# Patient Record
Sex: Female | Born: 1975 | Race: Black or African American | Hispanic: No | Marital: Single | Smoking: Never smoker
Health system: Southern US, Community
[De-identification: ages and names within clinical notes are randomized; demographics above are authoritative.]

## PROBLEM LIST (undated history)

## (undated) DIAGNOSIS — I10 Essential (primary) hypertension: Secondary | ICD-10-CM

---

## 2015-11-19 ENCOUNTER — Emergency Department (HOSPITAL_COMMUNITY)
Admission: EM | Admit: 2015-11-19 | Discharge: 2015-11-19 | Disposition: A | Discharge status: Home / Self Care | Payer: Self-pay | Attending: Emergency Medicine | Admitting: Emergency Medicine

## 2015-11-19 ENCOUNTER — Encounter (HOSPITAL_COMMUNITY): Payer: Self-pay | Admitting: Emergency Medicine

## 2015-11-19 DIAGNOSIS — R519 Headache, unspecified: Secondary | ICD-10-CM

## 2015-11-19 DIAGNOSIS — R51 Headache: Secondary | ICD-10-CM | POA: Insufficient documentation

## 2015-11-19 DIAGNOSIS — I10 Essential (primary) hypertension: Secondary | ICD-10-CM | POA: Insufficient documentation

## 2015-11-19 HISTORY — DX: Essential (primary) hypertension: I10

## 2015-11-19 MED ORDER — HYDROCHLOROTHIAZIDE 25 MG PO TABS: 25.0000 mg | ORAL_TABLET | Freq: Every day | ORAL | Status: DC

## 2015-11-19 MED ORDER — PROCHLORPERAZINE EDISYLATE 5 MG/ML IJ SOLN
10.0000 mg | Freq: Once | INTRAMUSCULAR | Status: AC
  Administered 2015-11-19: 10 mg via INTRAVENOUS
  Filled 2015-11-19: qty 2

## 2015-11-19 MED ORDER — KETOROLAC TROMETHAMINE 30 MG/ML IJ SOLN
30.0000 mg | Freq: Once | INTRAMUSCULAR | Status: AC
  Administered 2015-11-19: 30 mg via INTRAVENOUS
  Filled 2015-11-19: qty 1

## 2015-11-19 MED ORDER — DIPHENHYDRAMINE HCL 50 MG/ML IJ SOLN
25.0000 mg | Freq: Once | INTRAMUSCULAR | Status: AC
  Administered 2015-11-19: 25 mg via INTRAVENOUS
  Filled 2015-11-19: qty 1

## 2015-11-19 NOTE — ED Provider Notes (Signed)
CSN: [id number]     Arrival date & time 11/19/15  0227 History  By signing my name below, I, Freida Busman, attest that this documentation has been prepared under the direction and in the presence of Melene Plan, DO . Electronically Signed: Freida Busman, Scribe. 11/19/2015. 3:31 AM.     Chief Complaint  Patient presents with  . Hypertension  . Headache    The history is provided by the patient. No language interpreter was used.   HPI Comments:  Gwendolyn Kerr is a 40 y.o. female with a history of HTN who presents to the Emergency Department complaining of HA x 2 days with diffuse pain throughout. She rates her pain is a 7/10. Her pain is occasionally exacerbated by sounds. She reports h/o HA when BP is elevated but notes pain today is  Similar to past HAs. She reports taking a medication from [place] for her BP which she states is a diuretic but does not believe it is working. She denies CP at this time.   Past Medical History  Diagnosis Date  . Hypertension    History reviewed. No pertinent past surgical history. No family history on file. Social History  Substance Use Topics  . Smoking status: Never Smoker   . Smokeless tobacco: None  . Alcohol Use: No   OB History    No data available     Review of Systems  Constitutional: Negative for fever and chills.  HENT: Negative for congestion and rhinorrhea.   Eyes: Negative for redness and visual disturbance.  Respiratory: Negative for shortness of breath and wheezing.   Cardiovascular: Negative for chest pain and palpitations.  Gastrointestinal: Negative for nausea and vomiting.  Genitourinary: Negative for dysuria and urgency.  Musculoskeletal: Negative for myalgias and arthralgias.  Skin: Negative for pallor and wound.  Neurological: Positive for headaches. Negative for dizziness.  All other systems reviewed and are negative.  Allergies  Review of patient's allergies indicates not on file.  Home Medications   Prior to  Admission medications   Not on File   BP 158/98 mmHg  Pulse 66  Temp(Src) 98 F (36.7 C) (Oral)  Resp 18  Ht  (1.676 m)  Wt 192 lb (87.091 kg)  BMI 31.00 kg/m2  SpO2 98% Physical Exam  Constitutional: She is oriented to person, place, and time. She appears well-developed and well-nourished. No distress.  HENT:  Head: Normocephalic and atraumatic.  Eyes: EOM are normal. Pupils are equal, round, and reactive to light.  Neck: Normal range of motion. Neck supple.  Cardiovascular: Normal rate and regular rhythm.  Exam reveals no gallop and no friction rub.   No murmur heard. Pulmonary/Chest: Effort normal. She has no wheezes. She has no rales.  Abdominal: Soft. She exhibits no distension. There is no tenderness.  Musculoskeletal: She exhibits no edema or tenderness.  Neurological: She is alert and oriented to person, place, and time. She has normal strength. She is not disoriented. No cranial nerve deficit or sensory deficit. Coordination and gait normal. GCS eye subscore is 4. GCS verbal subscore is 5. GCS motor subscore is 6. She displays no Babinski's sign on the right side. She displays no Babinski's sign on the left side.  Reflex Scores:      Tricep reflexes are 2+ on the right side and 2+ on the left side.      Bicep reflexes are 2+ on the right side and 2+ on the left side.      Brachioradialis reflexes are  2+ on the right side and 2+ on the left side.      Patellar reflexes are 2+ on the right side and 2+ on the left side.      Achilles reflexes are 2+ on the right side and 2+ on the left side. Benign neuro exam  Skin: Skin is warm and dry. She is not diaphoretic.  Psychiatric: She has a normal mood and affect. Her behavior is normal.  Nursing note and vitals reviewed.   ED Course  Procedures   DIAGNOSTIC STUDIES:  Oxygen Saturation is 99% on RA, normal by my interpretation.    COORDINATION OF CARE:  3:28 AM Discussed treatment plan with pt at bedside and pt  agreed to plan.  Labs Review Labs Reviewed - No data to display  Imaging Review No results found. I have personally reviewed and evaluated these images and lab results as part of my medical decision-making.   EKG Interpretation None      MDM   Final diagnoses:  Acute nonintractable headache, unspecified headache type   40 yo F With a chief complaint of headache. This feels like her prior. She denies head injury fevers or chills. Benign neuro exam. No red flags. Treat with a headache cocktail.  Headache completely relieved with migraine cocktail. Discharge home.  5:16 AM:  I have discussed the diagnosis/risks/treatment options with the patient and believe the pt to be eligible for discharge home to follow-up with PCP. We also discussed returning to the ED immediately if new or worsening sx occur. We discussed the sx which are most concerning (e.g., sudden worsening pain, fever) that necessitate immediate return. Medications administered to the patient during their visit and any new prescriptions provided to the patient are listed below.  Medications given during this visit Medications  prochlorperazine (COMPAZINE) injection 10 mg (10 mg Intravenous Given 11/19/15 0431)  diphenhydrAMINE (BENADRYL) injection 25 mg (25 mg Intravenous Given 11/19/15 0427)  ketorolac (TORADOL) 30 MG/ML injection 30 mg (30 mg Intravenous Given 11/19/15 0434)    New Prescriptions   No medications on file    The patient appears reasonably screen and/or stabilized for discharge and I doubt any other medical condition or other [facility] requiring further screening, evaluation, or treatment in the ED at this time prior to discharge.     I personally performed the services described in this documentation, which was scribed in my presence. The recorded information has been reviewed and is accurate.     Melene Planan Chyler Creely, DO 11/19/15 [phone]

## 2015-11-19 NOTE — ED Notes (Signed)
C/o high blood pressure and headache since Sunday morning.  Reports she takes BP medication from [place] but she doesn't think it is working because it is a diuretic and she hasn't been urinating more.

## 2015-11-19 NOTE — Discharge Instructions (Signed)

## 2018-02-05 ENCOUNTER — Encounter (HOSPITAL_COMMUNITY): Payer: Self-pay | Admitting: Emergency Medicine

## 2018-02-05 ENCOUNTER — Emergency Department (HOSPITAL_COMMUNITY)
Admission: EM | Admit: 2018-02-05 | Discharge: 2018-02-06 | Disposition: A | Discharge status: Home / Self Care | Payer: Self-pay | Attending: Emergency Medicine | Admitting: Emergency Medicine

## 2018-02-05 ENCOUNTER — Other Ambulatory Visit: Payer: Self-pay

## 2018-02-05 DIAGNOSIS — Z8679 Personal history of other diseases of the circulatory system: Secondary | ICD-10-CM

## 2018-02-05 DIAGNOSIS — I10 Essential (primary) hypertension: Secondary | ICD-10-CM | POA: Insufficient documentation

## 2018-02-05 DIAGNOSIS — Z79899 Other long term (current) drug therapy: Secondary | ICD-10-CM | POA: Insufficient documentation

## 2018-02-05 DIAGNOSIS — Z9114 Patient's other noncompliance with medication regimen: Secondary | ICD-10-CM

## 2018-02-05 LAB — URINALYSIS, ROUTINE W REFLEX MICROSCOPIC
BILIRUBIN URINE: NEGATIVE
Bacteria, UA: NONE SEEN
GLUCOSE, UA: NEGATIVE mg/dL
Ketones, ur: NEGATIVE mg/dL
LEUKOCYTES UA: NEGATIVE
NITRITE: NEGATIVE
PH: 7 (ref 5.0–8.0)
Protein, ur: 30 mg/dL — AB
Specific Gravity, Urine: 1.004 — ABNORMAL LOW (ref 1.005–1.030)

## 2018-02-05 LAB — CBC
HCT: 52.2 % — ABNORMAL HIGH (ref 36.0–46.0)
Hemoglobin: 16.4 g/dL — ABNORMAL HIGH (ref 12.0–15.0)
MCH: 26.8 pg (ref 26.0–34.0)
MCHC: 31.4 g/dL (ref 30.0–36.0)
MCV: 85.3 fL (ref 78.0–100.0)
PLATELETS: 210 10*3/uL (ref 150–400)
RBC: 6.12 MIL/uL — AB (ref 3.87–5.11)
RDW: 13.4 % (ref 11.5–15.5)
WBC: 9.3 10*3/uL (ref 4.0–10.5)

## 2018-02-05 LAB — BASIC METABOLIC PANEL
ANION GAP: 15 (ref 5–15)
BUN: 12 mg/dL (ref 6–20)
CO2: 29 mmol/L (ref 22–32)
CREATININE: 0.96 mg/dL (ref 0.44–1.00)
Calcium: 10.4 mg/dL — ABNORMAL HIGH (ref 8.9–10.3)
Chloride: 92 mmol/L — ABNORMAL LOW (ref 98–111)
GFR calc Af Amer: >60 mL/min (ref >60)
GFR calc non Af Amer: >60 mL/min (ref >60)
GLUCOSE: 118 mg/dL — AB (ref 70–99)
POTASSIUM: 3.3 mmol/L — AB (ref 3.5–5.1)
Sodium: 136 mmol/L (ref 135–145)

## 2018-02-05 NOTE — ED Provider Notes (Signed)
[facility] EMERGENCY DEPARTMENT Provider Note   CSN: [id number] Arrival date & time: 02/05/18  1751     History   Chief Complaint Chief Complaint  Patient presents with  . Hypertension    HPI Gwendolyn Kerr is a 42 y.o. female.  Patient presents with concern for elevated blood pressure and symptoms of "fogginess" and muscle pain in hands and sometimes feet that she feels is associated with elevated pressure. No specific headache. No weakness, numbness, visual change, speech difficulty. She was treated with HCTZ for about one year until 4 months ago when her doctor changed her to Amiloride/HCTZ 5/50 mg QD. She felt that this was too much medication and has been taking 1/2 tablet every other day since it was prescribed. She takes her blood pressure reading often and reports the first high reading she has had was a few days ago and has had several since then. Yesterday she increased her choice of dose to a whole tablet. No chest pain, SOB, nausea, diaphoresis. No history of ischemic heart disease. She is currently asymptomatic.   The history is provided by the patient and a relative. No language interpreter was used.  Hypertension  Pertinent negatives include no chest pain and no shortness of breath.    Past Medical History:  Diagnosis Date  . Hypertension     There are no active problems to display for this patient.   History reviewed. No pertinent surgical history.   OB History   None      Home Medications    Prior to Admission medications   Medication Sig Start Date End Date Taking? Authorizing Provider  hydrochlorothiazide (HYDRODIURIL) 25 MG tablet Take 1 tablet (25 mg total) by mouth daily. 11/19/15   Melene Plan, DO    Family History No family history on file.  Social History Social History   Tobacco Use  . Smoking status: Never Smoker  . Smokeless tobacco: Never Used  Substance Use Topics  . Alcohol use: No  . Drug use: No      Allergies   Patient has no allergy information on record.   Review of Systems Review of Systems  Constitutional: Negative for chills, diaphoresis and fever.  HENT: Negative.        Reports her mouth is constantly dry.  Respiratory: Negative.  Negative for cough and shortness of breath.   Cardiovascular: Negative.  Negative for chest pain, palpitations and leg swelling.  Gastrointestinal: Negative.  Negative for nausea and vomiting.  Musculoskeletal:       See HPI.  Skin: Negative.   Neurological:       See HPI.     Physical Exam Updated Vital Signs BP (!) 137/93 (BP Location: Left Arm)   Pulse 76   Resp 18   SpO2 99%   Physical Exam  Constitutional: She is oriented to person, place, and time. She appears well-developed and well-nourished.  HENT:  Head: Normocephalic.  Neck: Normal range of motion. Neck supple. Carotid bruit is not present.  Cardiovascular: Normal rate, regular rhythm and intact distal pulses.  No murmur heard. Pulmonary/Chest: Effort normal and breath sounds normal. She has no wheezes. She has no rales. She exhibits no tenderness.  Abdominal: Soft. Bowel sounds are normal. There is no tenderness. There is no rebound and no guarding.  Musculoskeletal: Normal range of motion. She exhibits no edema.  Neurological: She is alert and oriented to person, place, and time.  Skin: Skin is warm and dry. No rash noted.  Psychiatric: She has a normal mood and affect.     ED Treatments / Results  Labs (all labs ordered are listed, but only abnormal results are displayed) Labs Reviewed  CBC - Abnormal; Notable for the following components:      Result Value   RBC 6.12 (*)    Hemoglobin 16.4 (*)    HCT 52.2 (*)    All other components within normal limits  BASIC METABOLIC PANEL - Abnormal; Notable for the following components:   Potassium 3.3 (*)    Chloride 92 (*)    Glucose, Bld 118 (*)    Calcium 10.4 (*)    All other components within normal  limits  URINALYSIS, ROUTINE W REFLEX MICROSCOPIC - Abnormal; Notable for the following components:   Color, Urine STRAW (*)    Specific Gravity, Urine 1.004 (*)    Hgb urine dipstick MODERATE (*)    Protein, ur 30 (*)    All other components within normal limits   Results for orders placed or performed during the hospital encounter of 02/05/18  CBC  Result Value Ref Range   WBC 9.3 4.0 - 10.5 K/uL   RBC 6.12 (H) 3.87 - 5.11 MIL/uL   Hemoglobin 16.4 (H) 12.0 - 15.0 g/dL   HCT 16.1 (H) 09.6 - 04.5 %   MCV 85.3 78.0 - 100.0 fL   MCH 26.8 26.0 - 34.0 pg   MCHC 31.4 30.0 - 36.0 g/dL   RDW 40.9 81.1 - 91.4 %   Platelets 210 150 - 400 K/uL  Basic metabolic panel  Result Value Ref Range   Sodium 136 135 - 145 mmol/L   Potassium 3.3 (L) 3.5 - 5.1 mmol/L   Chloride 92 (L) 98 - 111 mmol/L   CO2 29 22 - 32 mmol/L   Glucose, Bld 118 (H) 70 - 99 mg/dL   BUN 12 6 - 20 mg/dL   Creatinine, Ser 7.82 0.44 - 1.00 mg/dL   Calcium 95.6 (H) 8.9 - 10.3 mg/dL   GFR calc non Af Amer >60 >60 mL/min   GFR calc Af Amer >60 >60 mL/min   Anion gap 15 5 - 15  Urinalysis, Routine w reflex microscopic  Result Value Ref Range   Color, Urine STRAW (A) YELLOW   APPearance CLEAR CLEAR   Specific Gravity, Urine 1.004 (L) 1.005 - 1.030   pH 7.0 5.0 - 8.0   Glucose, UA NEGATIVE NEGATIVE mg/dL   Hgb urine dipstick MODERATE (A) NEGATIVE   Bilirubin Urine NEGATIVE NEGATIVE   Ketones, ur NEGATIVE NEGATIVE mg/dL   Protein, ur 30 (A) NEGATIVE mg/dL   Nitrite NEGATIVE NEGATIVE   Leukocytes, UA NEGATIVE NEGATIVE   RBC / HPF 0-5 0 - 5 RBC/hpf   WBC, UA 0-5 0 - 5 WBC/hpf   Bacteria, UA NONE SEEN NONE SEEN   Squamous Epithelial / LPF 0-5 0 - 5     EKG EKG Interpretation  Date/Time:  Friday February 05 2018 18:50:35 EDT Ventricular Rate:  97 PR Interval:  136 QRS Duration: 86 QT Interval:  360 QTC Calculation: 457 R Axis:   92 Text Interpretation:  Normal sinus rhythm Rightward axis Borderline ECG No old  tracing to compare Confirmed by Dione Booze ([id number]) on 02/05/2018 10:45:12 PM   Radiology No results found.  Procedures Procedures (including critical care time)  Medications Ordered in ED Medications - No data to display   Initial Impression / Assessment and Plan / ED Course  I have reviewed the triage  vital signs and the nursing notes.  Pertinent labs & imaging results that were available during my care of the patient were reviewed by me and considered in my medical decision making (see chart for details).     Patient here with concern for high blood pressure. She has not been taking her medication as prescribed. See HPI. No syncope, symptoms concerning for ACS, neurologic deficits.   Labs remarkable only for minimally low potassium. Will supplement for the next 2-3 days. She states she does not like the current medication and that she would be compliant with going back on HCTZ alone. Will give 14 days and strongly encouraged PCP follow up for re-evaluation of medications.   Final Clinical Impressions(s) / ED Diagnoses   Final diagnoses:  None   1. History of HTN 2. Medication noncompliance  ED Discharge Orders    None       Danne HarborUpstill, Emmanuela Ghazi, PA-C 02/06/18 0008    Dione BoozeGlick, David, MD 02/06/18 [phone]

## 2018-02-05 NOTE — ED Provider Notes (Signed)
Patient placed in Quick Look pathway, seen and evaluated   Chief Complaint: Hypertension, weakness, occasional headaches  HPI: Patient presents the emergency department with concern over elevated blood pressure readings recently and generalized weakness and fatigue.  She had a headache last weekend and noted that her blood pressure was high.  She noted a high blood pressure readings yesterday and today, up to 190/118.  No current headaches or chest pains.  No vision changes.  No fevers or vomiting however she has been nauseous.  She denies urinary symptoms.  She is currently on a medication containing HCTZ.  ROS:  Positive ROS: (+) Weakness, fatigue Negative ROS: (-) Chest pain, vomiting  Physical Exam:   Gen: No distress  Neuro: Awake and Alert  Skin: Warm    Focused Exam: Heart RRR, nml S1,S2, no m/r/g; Lungs CTAB; Abd soft, NT, no rebound or guarding; Ext 2+ pedal pulses bilaterally, no edema.  BP (!) 158/95 (BP Location: Left Arm)   Pulse 93   Resp 20   SpO2 100%   Plan: Given vague complaints, will check CBC, BMP, UA, and EKG.  Low concern for ACS as patient does not have chest pain or shortness of breath.  Initiation of care has begun. The patient has been counseled on the process, plan, and necessity for staying for the completion/evaluation, and the remainder of the medical screening examination    Renne CriglerGeiple, Jasemine Nawaz, Cordelia Poche-C 02/05/18 1843    Cathren LaineSteinl, Kevin, MD 02/05/18 2225

## 2018-02-05 NOTE — ED Triage Notes (Signed)
Pt reports that she is not feeling like herself and that she has been taking her BP and it has been consistently high. Denies headache or chest pain at this time. Reports that she thinks she may be dehydrated.

## 2018-02-06 MED ORDER — HYDROCHLOROTHIAZIDE 25 MG PO TABS: 25.0000 mg | ORAL_TABLET | Freq: Every day | ORAL | 0 refills | Status: DC

## 2018-02-06 MED ORDER — POTASSIUM CHLORIDE CRYS ER 20 MEQ PO TBCR: 40.0000 meq | EXTENDED_RELEASE_TABLET | Freq: Every day | ORAL | 0 refills | Status: DC

## 2018-02-06 NOTE — Discharge Instructions (Addendum)
See your doctor this next week for recheck of high blood pressure, symptoms of anxiety and for evaluation of medications. Return here with any new symptoms or concerns.

## 2018-02-10 ENCOUNTER — Inpatient Hospital Stay (HOSPITAL_COMMUNITY)
Admission: EM | Admit: 2018-02-10 | Discharge: 2018-02-11 | DRG: 641 | Disposition: A | Discharge status: Home / Self Care | Payer: Self-pay | Attending: Family Medicine | Admitting: Family Medicine

## 2018-02-10 ENCOUNTER — Other Ambulatory Visit: Payer: Self-pay

## 2018-02-10 ENCOUNTER — Encounter (HOSPITAL_COMMUNITY): Payer: Self-pay | Admitting: Emergency Medicine

## 2018-02-10 ENCOUNTER — Emergency Department (HOSPITAL_COMMUNITY): Payer: Self-pay

## 2018-02-10 ENCOUNTER — Ambulatory Visit (HOSPITAL_COMMUNITY)
Admission: EM | Admit: 2018-02-10 | Discharge: 2018-02-10 | Disposition: A | Discharge status: Home / Self Care | Payer: Self-pay | Attending: Internal Medicine | Admitting: Internal Medicine

## 2018-02-10 DIAGNOSIS — Z79899 Other long term (current) drug therapy: Secondary | ICD-10-CM | POA: Insufficient documentation

## 2018-02-10 DIAGNOSIS — E876 Hypokalemia: Secondary | ICD-10-CM | POA: Insufficient documentation

## 2018-02-10 DIAGNOSIS — R5383 Other fatigue: Secondary | ICD-10-CM | POA: Insufficient documentation

## 2018-02-10 DIAGNOSIS — R9431 Abnormal electrocardiogram [ECG] [EKG]: Secondary | ICD-10-CM | POA: Diagnosis present

## 2018-02-10 DIAGNOSIS — I10 Essential (primary) hypertension: Secondary | ICD-10-CM | POA: Insufficient documentation

## 2018-02-10 DIAGNOSIS — R000 Tachycardia, unspecified: Secondary | ICD-10-CM | POA: Insufficient documentation

## 2018-02-10 DIAGNOSIS — D751 Secondary polycythemia: Secondary | ICD-10-CM | POA: Diagnosis present

## 2018-02-10 DIAGNOSIS — Z791 Long term (current) use of non-steroidal anti-inflammatories (NSAID): Secondary | ICD-10-CM

## 2018-02-10 DIAGNOSIS — F419 Anxiety disorder, unspecified: Secondary | ICD-10-CM | POA: Diagnosis present

## 2018-02-10 DIAGNOSIS — R011 Cardiac murmur, unspecified: Secondary | ICD-10-CM | POA: Diagnosis present

## 2018-02-10 LAB — POCT I-STAT, CHEM 8
BUN: 8 mg/dL (ref 6–20)
CALCIUM ION: 1.07 mmol/L — AB (ref 1.15–1.40)
CHLORIDE: 92 mmol/L — AB (ref 98–111)
CREATININE: 0.8 mg/dL (ref 0.44–1.00)
Glucose, Bld: 130 mg/dL — ABNORMAL HIGH (ref 70–99)
HEMATOCRIT: 53 % — AB (ref 36.0–46.0)
Hemoglobin: 18 g/dL — ABNORMAL HIGH (ref 12.0–15.0)
Potassium: 2.6 mmol/L — CL (ref 3.5–5.1)
SODIUM: 136 mmol/L (ref 135–145)
TCO2: 29 mmol/L (ref 22–32)

## 2018-02-10 LAB — MAGNESIUM: Magnesium: 2.2 mg/dL (ref 1.7–2.4)

## 2018-02-10 LAB — CBC
HCT: 50.2 % — ABNORMAL HIGH (ref 36.0–46.0)
HEMOGLOBIN: 16.1 g/dL — AB (ref 12.0–15.0)
MCH: 27 pg (ref 26.0–34.0)
MCHC: 32.1 g/dL (ref 30.0–36.0)
MCV: 84.2 fL (ref 78.0–100.0)
Platelets: 217 10*3/uL (ref 150–400)
RBC: 5.96 MIL/uL — AB (ref 3.87–5.11)
RDW: 13.3 % (ref 11.5–15.5)
WBC: 9.6 10*3/uL (ref 4.0–10.5)

## 2018-02-10 LAB — BASIC METABOLIC PANEL
ANION GAP: 19 — AB (ref 5–15)
BUN: 6 mg/dL (ref 6–20)
CHLORIDE: 89 mmol/L — AB (ref 98–111)
CO2: 29 mmol/L (ref 22–32)
Calcium: 10.1 mg/dL (ref 8.9–10.3)
Creatinine, Ser: 0.91 mg/dL (ref 0.44–1.00)
GFR calc non Af Amer: >60 mL/min (ref >60)
Glucose, Bld: 180 mg/dL — ABNORMAL HIGH (ref 70–99)
Potassium: 2.4 mmol/L — CL (ref 3.5–5.1)
Sodium: 137 mmol/L (ref 135–145)

## 2018-02-10 LAB — TSH: TSH: 1.497 u[IU]/mL (ref 0.350–4.500)

## 2018-02-10 LAB — I-STAT TROPONIN, ED: Troponin i, poc: 0 ng/mL (ref 0.00–0.08)

## 2018-02-10 MED ORDER — POTASSIUM CHLORIDE 20 MEQ PO PACK
40.0000 meq | PACK | Freq: Once | ORAL | Status: AC
  Administered 2018-02-10: 40 meq via ORAL
  Filled 2018-02-10: qty 2

## 2018-02-10 MED ORDER — POTASSIUM CHLORIDE CRYS ER 20 MEQ PO TBCR
40.0000 meq | EXTENDED_RELEASE_TABLET | Freq: Three times a day (TID) | ORAL | Status: AC
  Administered 2018-02-11 (×2): 40 meq via ORAL
  Filled 2018-02-10 (×3): qty 2

## 2018-02-10 MED ORDER — POTASSIUM CHLORIDE 10 MEQ/100ML IV SOLN
10.0000 meq | INTRAVENOUS | Status: AC
  Administered 2018-02-10 (×2): 10 meq via INTRAVENOUS
  Filled 2018-02-10 (×2): qty 100

## 2018-02-10 MED ORDER — AMLODIPINE BESYLATE 5 MG PO TABS: 5.0000 mg | ORAL_TABLET | Freq: Every day | ORAL | 0 refills | Status: DC

## 2018-02-10 MED ORDER — NAPROXEN 375 MG PO TABS: 375.0000 mg | ORAL_TABLET | Freq: Two times a day (BID) | ORAL | 0 refills | Status: DC

## 2018-02-10 MED ORDER — POTASSIUM CHLORIDE 20 MEQ PO PACK
40.0000 meq | PACK | Freq: Three times a day (TID) | ORAL | Status: DC
  Filled 2018-02-10: qty 2

## 2018-02-10 NOTE — ED Provider Notes (Signed)
[facility] EMERGENCY DEPARTMENT Provider Note   CSN: [id number] Arrival date & time: 02/10/18  1911   History   Chief Complaint Chief Complaint  Patient presents with  . Referral    Referred by Urgent Care    HPI Gwendolyn Kerr is a 42 y.o. female.  She presents to the emergency department today for evaluation after going to urgent care today and found to have potassium of 2.6.  Patient reports that she had a blood pressure that was elevated today to 180/120 and could "feel my heart pounding in my head" prompting her to go to urgent care for evaluation.  Denies any nausea, vomiting, vision changes.  No numbness or weakness.  Also with pounding sensation in her chest.  Has had no chest pain or pressure.  No shortness of breath.  Has had fatigue and tingling sensation in her mouth.  Was evaluated for similar tingling sensation on 7/26 as well as hypertension.  Was found at that time to have potassium of 3.3 and was discharged with supplemental potassium.  Patient states she took it on Saturday as well as today.  No other symptoms or concerns at this time.  No fevers or chills.  No other medication changes.  Recently started taking only HCTZ again and self discontinued amlodipine.  HPI  Past Medical History:  Diagnosis Date  . Hypertension     Patient Active Problem List   Diagnosis Date Noted  . Hypokalemia 02/10/2018  . Hypertension 02/10/2018    No past surgical history on file.   OB History   None      Home Medications    Prior to Admission medications   Medication Sig Start Date End Date Taking? Authorizing Provider  amiloride-hydrochlorothiazide (MODURETIC) 5-50 MG tablet Take 1 tablet by mouth daily. 12/01/17  Yes [provider]  Multiple Vitamin (MULTIVITAMIN) tablet Take 1 tablet by mouth daily.   Yes [provider]  amLODipine (NORVASC) 5 MG tablet Take 1 tablet (5 mg total) by mouth daily. Patient not taking: Reported on  02/10/2018 02/10/18   Wieters, Hallie C, PA-C  hydrochlorothiazide (HYDRODIURIL) 25 MG tablet Take 1 tablet (25 mg total) by mouth daily. Patient not taking: Reported on 02/10/2018 02/06/18   Elpidio Anis, PA-C  naproxen (NAPROSYN) 375 MG tablet Take 1 tablet (375 mg total) by mouth 2 (two) times daily. 02/10/18   Wieters, Hallie C, PA-C  potassium chloride SA (K-DUR,KLOR-CON) 20 MEQ tablet Take 2 tablets (40 mEq total) by mouth daily. For the next 2 days Patient not taking: Reported on 02/10/2018 02/06/18   Elpidio Anis, PA-C    Family History No family history on file.  Social History Social History   Tobacco Use  . Smoking status: Never Smoker  . Smokeless tobacco: Never Used  Substance Use Topics  . Alcohol use: No  . Drug use: No     Allergies   Patient has no known allergies.   Review of Systems Review of Systems  Constitutional: Positive for fatigue. Negative for chills and fever.  HENT: Negative for congestion and sore throat.   Eyes: Negative for photophobia and visual disturbance.  Respiratory: Negative for cough and shortness of breath.   Cardiovascular: Negative for chest pain and leg swelling.  Gastrointestinal: Negative for abdominal pain, diarrhea, nausea and vomiting.  Genitourinary: Negative for dysuria and hematuria.  Musculoskeletal: Positive for myalgias. Negative for back pain and neck pain.  Skin: Negative for color change and rash.  Neurological: Positive for  headaches. Negative for weakness.       "tingling" in mouth  All other systems reviewed and are negative.    Physical Exam Updated Vital Signs BP 126/90   Pulse 72   Temp 98.4 F (36.9 C) (Oral)   Resp 18   Ht 5\' 7"  (1.702 m)   SpO2 98%   BMI 30.07 kg/m   Physical Exam  Constitutional: She appears well-developed and well-nourished. No distress.  HENT:  Head: Normocephalic and atraumatic.  Eyes: Conjunctivae are normal.  Neck: Neck supple.  Cardiovascular: Regular rhythm and intact  distal pulses.  Pulmonary/Chest: Effort normal and breath sounds normal. No respiratory distress.  Abdominal: Soft. She exhibits no distension. There is no tenderness.  Musculoskeletal: She exhibits no edema.  Neurological: She is alert.  Alert and oriented x3.  Cranial nerves II-XII intact.  No facial asymmetry.  5/5 grip strength bilaterally.  5/5 bicep flexion and tricep extension bilaterally. 5/5 flexion/extension at knee, hip, and ankles. No discoordination of FNF, heel to shin, and ambulates without ataxia or antalgic gait.    Skin: Skin is warm and dry.  Psychiatric: She has a normal mood and affect.  Nursing note and vitals reviewed.   ED Treatments / Results  Labs (all labs ordered are listed, but only abnormal results are displayed) Labs Reviewed  BASIC METABOLIC PANEL - Abnormal; Notable for the following components:      Result Value   Potassium 2.4 (*)    Chloride 89 (*)    Glucose, Bld 180 (*)    Anion gap 19 (*)    All other components within normal limits  CBC - Abnormal; Notable for the following components:   RBC 5.96 (*)    Hemoglobin 16.1 (*)    HCT 50.2 (*)    All other components within normal limits  MAGNESIUM  I-STAT TROPONIN, ED    EKG None  Radiology Dg Chest 2 View  Result Date: 02/10/2018 CLINICAL DATA:  Shortness of breath EXAM: CHEST - 2 VIEW COMPARISON:  None. FINDINGS: Lungs are clear.  No pleural effusion or pneumothorax. The heart is normal in size. Visualized osseous structures are within normal limits. IMPRESSION: Normal chest radiographs. Electronically Signed   By: Charline Bills M.D.   On: 02/10/2018 21:02    Procedures Procedures (including critical care time)  Medications Ordered in ED Medications  potassium chloride SA (K-DUR,KLOR-CON) CR tablet 40 mEq (has no administration in time range)  potassium chloride 10 mEq in 100 mL IVPB (0 mEq Intravenous Stopped 02/10/18 2345)  potassium chloride (KLOR-CON) packet 40 mEq (40 mEq  Oral Given 02/10/18 2200)     Initial Impression / Assessment and Plan / ED Course  I have reviewed the triage vital signs and the nursing notes.  Pertinent labs & imaging results that were available during my care of the patient were reviewed by me and considered in my medical decision making (see chart for details).    41yoF presents to the emergency department today for evaluation after going to urgent care today and found to have potassium of 2.6.  Afebrile, hemodynamically stable, well-appearing at presentation.  No chest pain.  No headache.  No shortness of breath.  Complaining of fatigue and perioral tingling as biggest concerns at this time.  Labs obtained  at urgent care and from triage reviewed remarkable for potassium of 2.4, normal magnesium, stable renal function.  CBC is unremarkable. Trop from triage undetectable. Pt denies having any CP for me. CXR no acute  cardiopulm abnormality. Given symptomatic profound hypoK, discussed with medicine team who will admit for further obs and repletion. IV and PO repletion initiated in ED. Pt updated and agreeable with plan. Stable in ED w/no acute events.   Case and plan of care discussed with Dr. Anitra LauthPlunkett.   Final Clinical Impressions(s) / ED Diagnoses   Final diagnoses:  Hypokalemia    ED Discharge Orders    None       Rigoberto Noelickens, Kristjan Derner, MD 02/11/18 [id number]    Gwyneth SproutPlunkett, Whitney, MD 02/11/18 Glena [id number]    Gwyneth SproutPlunkett, Whitney, MD 02/25/18 1041

## 2018-02-10 NOTE — Discharge Instructions (Addendum)
Please add in amlodipine daily with your current blood pressure medicine Continue to monitor your blood pressure  We are checking your thyroid, we will call you if this is abnormal  Please use Naprosyn twice daily as needed for any pains/muscle aches  Please establish care with [facility], please contact them tomorrow morning-they can help further manage her blood pressure, anxiety  Please go to emergency room if symptoms worsening, developing episode of passing out, worsening dizziness, lightheadedness, chest pain, shortness of breath

## 2018-02-10 NOTE — ED Triage Notes (Signed)
Patient states that she was referred to us by Urgent Care for a low potassium. Denies CP or SOB. Only complaint is being cold.

## 2018-02-10 NOTE — ED Triage Notes (Addendum)
Patient reports she is not feeling well.  Denies chest pain, denies headache.  Patient reports sob.  Respirations regular and unlabored.  Skin warm and dry.  Patient denies headache.  Seen in ed one week ago and could not find anything wrong per patient sister.  Patient says she feels like she did then  Patient is not taking medicines as instructed.  Has taken one bp med today, but not yesterday.  Did not take potassium yesterday, but took 2 today.  Patient is also taking something from [place].  Cannot get a clear understanding what it is or what for, patient keeps saying it does not bother other medicines

## 2018-02-10 NOTE — H&P (Addendum)
Family Medicine Teaching [facility] Admission History and Physical Service Pager: [phone]  Patient name: Gwendolyn Kerr Medical record number: [id number] Date of birth: 1976-01-06 Age: 42 y.o. Gender: female  Primary Care Provider: Patient, No Pcp Per Consultants: none Code Status: Full  Chief Complaint: hypertension and hypokalemia  Assessment and Plan: Gwendolyn Kerr is a 42 y.o. female presenting with hypertension and hypokalemia. PMH is significant for HTN.   Hypokalemia:  Was found to have potassium of 2.7 at urgent care and was told to come to the hospital.  On admission potassium was 2.4.  Was seen in the ED on 7/26 for hypertension at which time amiloride was added to her hypertensive regimen.  She had slightly low potassium at that time so she was sent home with potassium supplementation.  She has been taking amiloride and HCTZ since ED visit and has finished her course of potassium supplements.  Patient has also reported some muscular cramping in the past week.  This hypokalemia is most likely related to the new loop diuretic in addition to her hydrochlorothiazide.  ECG shows QTC prolongation in addition to U waves. -Admit to telemetry, attending Dr. Deirdre Priest -Monitor potassium -Discontinue amiloride -A.m. EKG -Up with assistance, monitor vitals - S/p 60 meq K in ED. Will give 2 more doses of K and continue to monitor on BMP  Hypertension: Since that discharge she has continued to record high blood pressures with systolics in the 180s while taking amiloride/HCTZ.  In light of patient's hypokalemia is appropriate to change her anti-antihypertensive medication.  When patient was seen at urgent care today she was prescribed amlodipine.  Patient was not taking amlodipine prior to urgent care visit on 7/31. - Discontinue amiloride - Continue amlodipine - Continue HCTZ  Anxiety: Patient reported significant feelings of anxiety.  Does not have a medical history significant  for anxiety and is not been medicated for anxiety in the past.  She does not seem to be a new symptom but has been ongoing for at least a year.  At times patient feels her heart beating rapidly and can feel the pulse in her head.  The symptoms the patient described did not fit panic attack.  She feels that she has had significant stress recently related to her family some of which is in [place].  Patient requested medication to help her with anxiety. -Start sertraline 25 mg daily -Follow-up with PCP for appropriate management potential increase sertraline  New murmur: Patient with new murmur on exam.  - ECHO to eval  Polycythemia: Hgb 16.1 on admission. 16.4 on 7/26 at hospital visit.  - Monitor on CBC - Outpatient follow up  FEN/GI: Heart healthy Prophylaxis: Lovenox  Disposition: admit to telemetry   History of Present Illness:  Gwendolyn Kerr is a 42 y.o. female presenting with hypertension and hypokalemia. her BP was high this afternoon. She had an ambulance called and it went to Urgent Care. At the urgent care her potassium was low. They sent her to the ED. She measures her BP at home and reports that her systolics had been in the 180's this week (185/120 today when she last checked). She has also noted left leg cramping in the past week.  It appears to be primarily her left leg. She reports chest pain intermittently 5 days ago, but denies any chest pain in the past few days and is currently not having any.  She was last seen in the hospital on 7/26 for hypertension.  She was given a combination  amiloride/HCTZ pill and a potassium supplement.  She has taken the BP pill consistently but did forget to take the potassium supp once.  She has now finished the potassium supplements she was given.  Gwendolyn Kerr state that she has been feeling very anxious recently. She reports moderately common episodes of her heart pounding in her head when she feels anxious/upset/disappointed. Often in the morning, she  spends a considerable time convincing herself to get out of bed and feels OK once she is up and about. She also has times where she has chills though she knows that she has not been febrile. She says something similar happened last year when she had stress. She says it has never happened before. Her family is in [place] and she is here and she feels bad about things happening with them there.   Review Of Systems: Per HPI with the following additions:   Review of Systems  Constitutional: Positive for chills. Negative for fever.  Cardiovascular: Positive for palpitations. Negative for chest pain.  Gastrointestinal: Positive for constipation. Negative for abdominal pain, diarrhea, nausea and vomiting.  Genitourinary: Negative for dysuria and frequency.  Musculoskeletal: Positive for myalgias.  Neurological: Positive for headaches. Negative for tremors.  Psychiatric/Behavioral: The patient is nervous/anxious.     Patient Active Problem List   Diagnosis Date Noted  . Hypokalemia 02/10/2018  . Hypertension 02/10/2018    Past Medical History: Past Medical History:  Diagnosis Date  . Hypertension     Past Surgical History: No past surgical history on file.  Social History: Social History   Tobacco Use  . Smoking status: Never Smoker  . Smokeless tobacco: Never Used  Substance Use Topics  . Alcohol use: No  . Drug use: No   Additional social history: lives with sister.  Please also refer to relevant sections of EMR.  Family History: No family history on file.  No significant family hx noted on chart review.    Allergies and Medications: No Known Allergies No current facility-administered medications on file prior to encounter.    Current Outpatient Medications on File Prior to Encounter  Medication Sig Dispense Refill  . amiloride-hydrochlorothiazide (MODURETIC) 5-50 MG tablet Take 1 tablet by mouth daily.  2  . Multiple Vitamin (MULTIVITAMIN) tablet Take 1 tablet by  mouth daily.    Marland Kitchen. amLODipine (NORVASC) 5 MG tablet Take 1 tablet (5 mg total) by mouth daily. (Patient not taking: Reported on 02/10/2018) 30 tablet 0  . hydrochlorothiazide (HYDRODIURIL) 25 MG tablet Take 1 tablet (25 mg total) by mouth daily. (Patient not taking: Reported on 02/10/2018) 14 tablet 0  . naproxen (NAPROSYN) 375 MG tablet Take 1 tablet (375 mg total) by mouth 2 (two) times daily. 20 tablet 0  . potassium chloride SA (K-DUR,KLOR-CON) 20 MEQ tablet Take 2 tablets (40 mEq total) by mouth daily. For the next 2 days (Patient not taking: Reported on 02/10/2018) 4 tablet 0    Objective: BP 126/90   Pulse 72   Temp 98.4 F (36.9 C) (Oral)   Resp 18   Ht 5\' 7"  (1.702 m)   SpO2 98%   BMI 30.07 kg/m  Exam: Physical Exam  Constitutional: She is oriented to person, place, and time. She appears well-developed and well-nourished. No distress.  Eyes: Pupils are equal, round, and reactive to light. EOM are normal.  Cardiovascular: Normal rate and regular rhythm. Exam reveals no gallop and no friction rub.  Murmur heard. Pulmonary/Chest: Effort normal and breath sounds normal. No stridor. No  respiratory distress. She has no wheezes. She has no rales.  Abdominal: Soft. Bowel sounds are normal. She exhibits no distension. There is no tenderness.  Musculoskeletal: She exhibits no edema, tenderness or deformity.  Lymphadenopathy:    She has no cervical adenopathy.  Neurological: She is alert and oriented to person, place, and time. No cranial nerve deficit.  Skin: Skin is warm. She is not diaphoretic.  Psychiatric:  Anxious     Labs and Imaging: CBC BMET  Recent Labs  Lab 02/10/18 2009  WBC 9.6  HGB 16.1*  HCT 50.2*  PLT 217   Recent Labs  Lab 02/10/18 2009  NA 137  K 2.4*  CL 89*  CO2 29  BUN 6  CREATININE 0.91  GLUCOSE 180*  CALCIUM 10.1     Dg Chest 2 View  Result Date: 02/10/2018 CLINICAL DATA:  Shortness of breath EXAM: CHEST - 2 VIEW COMPARISON:  None. FINDINGS:  Lungs are clear.  No pleural effusion or pneumothorax. The heart is normal in size. Visualized osseous structures are within normal limits. IMPRESSION: Normal chest radiographs. Electronically Signed   By: Charline Bills M.D.   On: 02/10/2018 21:02    Mirian Mo, MD 02/11/2018, 12:24 AM PGY-1, [facility] FPTS Intern pager: [phone], text pages welcome  FPTS Upper-Level Resident Addendum   I have independently interviewed and examined the patient. I have discussed the above with the original author and agree with their documentation. My edits for correction/addition/clarification are in purple. Please see also any attending notes.    [place] Haleem Hanner, DO PGY-2, [facility] 02/11/2018 1:12 AM  FPTS Service pager: [phone] (text pages welcome through [facility])

## 2018-02-10 NOTE — ED Notes (Signed)
Patient transported to X-ray 

## 2018-02-11 ENCOUNTER — Encounter (HOSPITAL_COMMUNITY): Payer: Self-pay | Admitting: *Deleted

## 2018-02-11 ENCOUNTER — Inpatient Hospital Stay (HOSPITAL_COMMUNITY): Payer: Self-pay

## 2018-02-11 DIAGNOSIS — R011 Cardiac murmur, unspecified: Secondary | ICD-10-CM

## 2018-02-11 DIAGNOSIS — F411 Generalized anxiety disorder: Secondary | ICD-10-CM

## 2018-02-11 LAB — BASIC METABOLIC PANEL
ANION GAP: 14 (ref 5–15)
ANION GAP: 12 (ref 5–15)
BUN: 9 mg/dL (ref 6–20)
BUN: 11 mg/dL (ref 6–20)
CALCIUM: 9.6 mg/dL (ref 8.9–10.3)
CHLORIDE: 94 mmol/L — AB (ref 98–111)
CO2: 30 mmol/L (ref 22–32)
CO2: 28 mmol/L (ref 22–32)
Calcium: 9.6 mg/dL (ref 8.9–10.3)
Chloride: 98 mmol/L (ref 98–111)
Creatinine, Ser: 0.94 mg/dL (ref 0.44–1.00)
Creatinine, Ser: 0.79 mg/dL (ref 0.44–1.00)
GFR calc non Af Amer: >60 mL/min (ref >60)
Glucose, Bld: 158 mg/dL — ABNORMAL HIGH (ref 70–99)
Glucose, Bld: 95 mg/dL (ref 70–99)
POTASSIUM: 3.2 mmol/L — AB (ref 3.5–5.1)
Potassium: 3.8 mmol/L (ref 3.5–5.1)
SODIUM: 138 mmol/L (ref 135–145)
SODIUM: 138 mmol/L (ref 135–145)

## 2018-02-11 LAB — CBC
HEMATOCRIT: 46.3 % — AB (ref 36.0–46.0)
HEMOGLOBIN: 14.9 g/dL (ref 12.0–15.0)
MCH: 27.3 pg (ref 26.0–34.0)
MCHC: 32.2 g/dL (ref 30.0–36.0)
MCV: 85 fL (ref 78.0–100.0)
Platelets: 185 10*3/uL (ref 150–400)
RBC: 5.45 MIL/uL — AB (ref 3.87–5.11)
RDW: 13.5 % (ref 11.5–15.5)
WBC: 8.9 10*3/uL (ref 4.0–10.5)

## 2018-02-11 LAB — ECHOCARDIOGRAM COMPLETE
Height: 67 in
Weight: 2970.04 oz

## 2018-02-11 LAB — HIV ANTIBODY (ROUTINE TESTING W REFLEX): HIV Screen 4th Generation wRfx: NONREACTIVE

## 2018-02-11 MED ORDER — HYDROCHLOROTHIAZIDE 25 MG PO TABS
25.0000 mg | ORAL_TABLET | Freq: Every day | ORAL | Status: DC
  Administered 2018-02-11: 25 mg via ORAL
  Filled 2018-02-11: qty 1

## 2018-02-11 MED ORDER — AMLODIPINE BESYLATE 5 MG PO TABS: 5.0000 mg | ORAL_TABLET | Freq: Every day | ORAL | 0 refills | Status: DC

## 2018-02-11 MED ORDER — ENOXAPARIN SODIUM 40 MG/0.4ML ~~LOC~~ SOLN
40.0000 mg | SUBCUTANEOUS | Status: DC
  Administered 2018-02-11: 40 mg via SUBCUTANEOUS
  Filled 2018-02-11: qty 0.4

## 2018-02-11 MED ORDER — SODIUM CHLORIDE 0.9 % IV SOLN: 250.0000 mL | INTRAVENOUS | Status: DC | PRN

## 2018-02-11 MED ORDER — SODIUM CHLORIDE 0.9% FLUSH
3.0000 mL | Freq: Two times a day (BID) | INTRAVENOUS | Status: DC
  Administered 2018-02-11: 3 mL via INTRAVENOUS

## 2018-02-11 MED ORDER — SODIUM CHLORIDE 0.9% FLUSH: 3.0000 mL | INTRAVENOUS | Status: DC | PRN

## 2018-02-11 MED ORDER — SERTRALINE HCL 25 MG PO TABS: 25.0000 mg | ORAL_TABLET | Freq: Every day | ORAL | 0 refills | Status: DC

## 2018-02-11 MED ORDER — SERTRALINE HCL 25 MG PO TABS
25.0000 mg | ORAL_TABLET | Freq: Every day | ORAL | Status: DC
  Administered 2018-02-11: 25 mg via ORAL
  Filled 2018-02-11: qty 1

## 2018-02-11 MED ORDER — AMLODIPINE BESYLATE 5 MG PO TABS
5.0000 mg | ORAL_TABLET | Freq: Every day | ORAL | Status: DC
  Administered 2018-02-11: 5 mg via ORAL
  Filled 2018-02-11: qty 1

## 2018-02-11 NOTE — Progress Notes (Signed)
Pt seen by MD, orders written for d/c.  Went over discharge instructions with pt and answered all questions.  Removed IV and telemetry, no complications.  Escorted for discharge via wheelchair with all belongings.  New prescriptions given.  Will follow up outpatient with MD.

## 2018-02-11 NOTE — ED Provider Notes (Signed)
[facility]    CSN: [id number] Arrival date & time: 02/10/18  1649     History   Chief Complaint Chief Complaint  Patient presents with  . Hypertension    HPI Gwendolyn Kerr is a 42 y.o. female history of hypertension presenting today for evaluation of blood pressure as well as sensations of headache and fatigue.  She states that she feels pulsating sensations across her body, but does not have true pains.  She is also concerned because her blood pressure has been reading 180/120.  She was seen in the emergency room approximately 5 days ago for similar symptoms.  She states that symptoms have not worsened, but she is still concerned about her blood pressure.  In the ED she was initially prescribed HCTZ 25 mg daily, but she has not been taking this, she is continued to take her amiloride/HCTZ combo as this has 50 mg of HCTZ and is worried about her blood pressure increasing by switching to the HCTZ 25.  Her potassium was also found to be slightly low, and started on potassium supplementation.  She took one the day of the emergency room visit, and took 3 today.  She does not have a PCP.  HPI  Past Medical History:  Diagnosis Date  . Hypertension     Patient Active Problem List   Diagnosis Date Noted  . Hypokalemia 02/10/2018  . Hypertension 02/10/2018    History reviewed. No pertinent surgical history.  OB History   None      Home Medications    Prior to Admission medications   Medication Sig Start Date End Date Taking? Authorizing Provider  hydrochlorothiazide (HYDRODIURIL) 25 MG tablet Take 1 tablet (25 mg total) by mouth daily. Patient not taking: Reported on 02/10/2018 02/06/18  Yes Upstill, Melvenia BeamShari, PA-C  Multiple Vitamin (MULTIVITAMIN) tablet Take 1 tablet by mouth daily.   Yes [provider]  potassium chloride SA (K-DUR,KLOR-CON) 20 MEQ tablet Take 2 tablets (40 mEq total) by mouth daily. For the next 2 days Patient not taking: Reported on  02/10/2018 02/06/18  Yes Upstill, Melvenia BeamShari, PA-C  amiloride-hydrochlorothiazide (MODURETIC) 5-50 MG tablet Take 1 tablet by mouth daily. 12/01/17   [provider]  amLODipine (NORVASC) 5 MG tablet Take 1 tablet (5 mg total) by mouth daily. Patient not taking: Reported on 02/10/2018 02/10/18   Wieters, Hallie C, PA-C  naproxen (NAPROSYN) 375 MG tablet Take 1 tablet (375 mg total) by mouth 2 (two) times daily. 02/10/18   Wieters, Junius CreamerHallie C, PA-C    Family History History reviewed. No pertinent family history.  Social History Social History   Tobacco Use  . Smoking status: Never Smoker  . Smokeless tobacco: Never Used  Substance Use Topics  . Alcohol use: No  . Drug use: No     Allergies   Patient has no known allergies.   Review of Systems Review of Systems  Constitutional: Positive for fatigue. Negative for fever.  HENT: Negative for congestion, sinus pressure and sore throat.   Eyes: Negative for photophobia, pain and visual disturbance.  Respiratory: Negative for cough and shortness of breath.   Cardiovascular: Negative for chest pain.  Gastrointestinal: Negative for abdominal pain, nausea and vomiting.  Genitourinary: Negative for decreased urine volume and hematuria.  Musculoskeletal: Negative for myalgias, neck pain and neck stiffness.  Neurological: Positive for headaches. Negative for dizziness, syncope, facial asymmetry, speech difficulty, weakness, light-headedness and numbness.     Physical Exam Triage Vital Signs ED Triage Vitals  Enc Vitals Group     BP 02/10/18 1709 (!) 176/90     Pulse Rate 02/10/18 1709 (!) 103     Resp 02/10/18 1709 (!) 26     Temp 02/10/18 1709 99.6 F (37.6 C)     Temp Source 02/10/18 1709 Oral     SpO2 02/10/18 1709 98 %     Weight --      Height --      Head Circumference --      Peak Flow --      Pain Score 02/10/18 1705 7     Pain Loc --      Pain Edu? --      Excl. in GC? --    No data found.  Updated Vital Signs BP  (!) 176/90 (BP Location: Left Arm)   Pulse (!) 103   Temp 99.6 F (37.6 C) (Oral)   Resp (!) 26   SpO2 98%   Visual Acuity Right Eye Distance:   Left Eye Distance:   Bilateral Distance:    Right Eye Near:   Left Eye Near:    Bilateral Near:     Physical Exam  Constitutional: She is oriented to person, place, and time. She appears well-developed and well-nourished. No distress.  HENT:  Head: Normocephalic and atraumatic.  Oral mucosa pink and moist, no tonsillar enlargement or exudate. Posterior pharynx patent and nonerythematous, no uvula deviation or swelling. Normal phonation.   Eyes: Conjunctivae are normal.  Neck: Neck supple.  Cardiovascular: Normal rate and regular rhythm.  No murmur heard. No carotid bruits  Pulmonary/Chest: Effort normal and breath sounds normal. No respiratory distress.  Breathing comfortably at rest, CTABL, no wheezing, rales or other adventitious sounds auscultated  Abdominal: Soft. There is no tenderness.  Musculoskeletal: She exhibits no edema.  Neurological: She is alert and oriented to person, place, and time.  Patient A&O x3, cranial nerves II-XII grossly intact, strength at shoulders, hips and knees 5/5, equal bilaterally, patellar reflex 2+ bilaterally.Negative Romberg and Pronator Drift. Gait without abnormality.  Skin: Skin is warm and dry.  Psychiatric: She has a normal mood and affect.  Nursing note and vitals reviewed.    UC Treatments / Results  Labs (all labs ordered are listed, but only abnormal results are displayed) Labs Reviewed  POCT I-STAT, CHEM 8 - Abnormal; Notable for the following components:      Result Value   Potassium 2.6 (*)    Chloride 92 (*)    Glucose, Bld 130 (*)    Calcium, Ion 1.07 (*)    Hemoglobin 18.0 (*)    HCT 53.0 (*)    All other components within normal limits  TSH    EKG None  Radiology Dg Chest 2 View  Result Date: 02/10/2018 CLINICAL DATA:  Shortness of breath EXAM: CHEST - 2 VIEW  COMPARISON:  None. FINDINGS: Lungs are clear.  No pleural effusion or pneumothorax. The heart is normal in size. Visualized osseous structures are within normal limits. IMPRESSION: Normal chest radiographs. Electronically Signed   By: Charline Bills M.D.   On: 02/10/2018 21:02    Procedures Procedures (including critical care time)  Medications Ordered in UC Medications - No data to display  Initial Impression / Assessment and Plan / UC Course  I have reviewed the triage vital signs and the nursing notes.  Pertinent labs & imaging results that were available during my care of the patient were reviewed by me and considered in my medical decision  making (see chart for details).    Potassium dropped from 3.3-2.6 and 5 days, patient not compliant with taking medicines.  EKG in clinic today showing increasing T wave inversion in leads V1 through V3 compared to EKG 5 days ago.  Recommend patient to go to emergency room for further evaluation/potassium replacement.Discussed strict return precautions. Patient verbalized understanding and is agreeable with plan.  Prior to potassium returning, discussed checking TSH as cause of fatigue, starting on amlodipine in addition to medicines, following up with community health and wellness.   Final Clinical Impressions(s) / UC Diagnoses   Final diagnoses:  Essential hypertension  Fatigue, unspecified type  Hypokalemia     Discharge Instructions     Please add in amlodipine daily with your current blood pressure medicine Continue to monitor your blood pressure  We are checking your thyroid, we will call you if this is abnormal  Please use Naprosyn twice daily as needed for any pains/muscle aches  Please establish care with community health and wellness, please contact them tomorrow morning-they can help further manage her blood pressure, anxiety  Please go to emergency room if symptoms worsening, developing episode of passing out, worsening  dizziness, lightheadedness, chest pain, shortness of breath     ED Prescriptions    Medication Sig Dispense Auth. Provider   amLODipine (NORVASC) 5 MG tablet Take 1 tablet (5 mg total) by mouth daily. Patient not taking:  Reported on 02/10/2018 30 tablet Wieters, Hallie C, PA-C   naproxen (NAPROSYN) 375 MG tablet Take 1 tablet (375 mg total) by mouth 2 (two) times daily. 20 tablet Wieters, [place] C, PA-C     Controlled Substance Prescriptions [place] Controlled Substance Registry consulted? Not Applicable   Lew Dawes, [place] 02/11/18 [id number]

## 2018-02-11 NOTE — Progress Notes (Signed)
  Echocardiogram 2D Echocardiogram has been performed.  Gwendolyn Kerr, Gwendolyn Kerr 02/11/2018, 10:51 AM

## 2018-02-11 NOTE — Discharge Summary (Signed)
[facility]  Patient name: Gwendolyn Kerr Medical record number: [id number] Date of birth: 03/20/1976 Age: 42 y.o. Gender: female Date of Admission: 02/10/2018  Date of Discharge: 02/11/2018 Admitting Physician: Carney LivingMarshall L Chambliss, MD  Primary Care Provider: Patient, No Pcp Per Consultants: None   Indication for Hospitalization: Hypokalemia   Discharge Diagnoses/Problem List:  Hypokalemia, resolved   Hypertension Anxiety  Polycythemia  Systolic murmur   Disposition: To home   Discharge Condition: Stable   Discharge Exam:  General: Alert, NAD HEENT: NCAT, MMM, oropharynx nonerythematous  Cardiac: Regular rate and rhythm, 2/6 systolic murmur noted, no g/r Lungs: Clear bilaterally, no increased WOB  Abdomen: soft, non-tender, non-distended, normoactive BS Msk: Moves all extremities spontaneously, full ROM, 5/5 strength   Ext: Warm, dry, 2+ distal pulses, no edema   Brief Hospital Course:   Hypokalemia:  Gwendolyn CottonJosephine is a 42 y.o. female with a past history of hypertension and anxiety that presented with hypokalemia. She initially presented to urgent care for elevated blood pressure, where it was incidentally discovered her K was 2.7 and subsequently sent to the ED. She was taking HCTZ-amiloride 5-50mg  at home for BP control. She did endorse some muscle cramping, with onset over the past few days.   On arrival, she was in no acute distress and potassium noted to be 2.4. EKG with QTc prolongation of 503 and u waves. Troponin negative. TSH wnl. Her potassium was repleted appropriately and 3.8 at discharge the following afternoon. EKG on 8/1 now with a normal QTc and no further ST changes. Her HCTZ 50 mg was thought to likely be the cause of her hypokalemia, and was discontinued.   Hypertension: Initially, her blood pressure was elevated to the 170's systolic, but normalized over the course of the day to 120's systolic with one dose of HCTZ 25  mg (received before it was d/c) and amlodipine 5mg . Sent home with 60 day supply of amlodipine 5 mg.   Anxiety: Pt endorsed feeling anxious often, worrying about her family in [place] and her safety in public, and requested a medication to help. She is functional, but feels this is impacting her life. Denied any SI, HI, or responding to internal stimuli. She was started on Sertraline 25mg  daily.     New Murmur: On evaluation, an approximate 1-2/6 systolic murmur was noted. The patient was not aware of a murmur previously. An Echo was obtained, showing a preserved EF of 60-65% with no valve regurgitation/stenosis, septal defects, or wall motion abnormalities. She was not anemic. Thought to likely be a flow murmur.     Issues for Follow Up:  1. Please follow up with a BMP to ensure K+ continues to be stable with cessation of diuretic.  2. Her HCTZ-amiloride BP medication was d/c'd and started on amlodipine 5mg . Please continue following her blood pressure and increase management as necessary.  3. She was started on Sertraline 25 mg for anxiety, please follow her mood closely.  4. Incidentally noted to have polycythemia on admission (18 on an H&H, but 16.1 with CBC), that trended down to 14.9. Please follow up with CBC.   Significant Procedures: None   Significant Labs and Imaging:  Recent Labs  Lab 02/10/18 1826 02/10/18 2009 02/11/18 0250  WBC  --  9.6 8.9  HGB 18.0* 16.1* 14.9  HCT 53.0* 50.2* 46.3*  PLT  --  217 185   Recent Labs  Lab 02/10/18 1826 02/10/18 2009 02/11/18 0250 02/11/18 1237  NA 136 137  138 138  K 2.6* 2.4* 3.2* 3.8  CL 92* 89* 94* 98  CO2  --  29 30 28   GLUCOSE 130* 180* 158* 95  BUN 8 6 9 11   CREATININE 0.80 0.91 0.94 0.79  CALCIUM  --  10.1 9.6 9.6  MG  --  2.2  --   --      Results/Tests Pending at Time of Discharge: None   Discharge Medications:  Allergies as of 02/11/2018   No Known Allergies     Medication List    STOP taking these medications    amiloride-hydrochlorothiazide 5-50 MG tablet Commonly known as:  MODURETIC   hydrochlorothiazide 25 MG tablet Commonly known as:  HYDRODIURIL   potassium chloride SA 20 MEQ tablet Commonly known as:  K-DUR,KLOR-CON     TAKE these medications   amLODipine 5 MG tablet Commonly known as:  NORVASC Take 1 tablet (5 mg total) by mouth daily.   multivitamin tablet Take 1 tablet by mouth daily.   naproxen 375 MG tablet Commonly known as:  NAPROSYN Take 1 tablet (375 mg total) by mouth 2 (two) times daily.   sertraline 25 MG tablet Commonly known as:  ZOLOFT Take 1 tablet (25 mg total) by mouth daily.       Discharge Instructions: Please refer to Patient Instructions section of EMR for full details.  Patient was counseled important signs and symptoms that should prompt return to medical care, changes in medications, dietary instructions, activity restrictions, and follow up appointments.   Follow-Up Appointments: Follow-up Information    [facility] Follow up on 03/16/2018.   Why:  3:50 for hospital follow up Contact information: [place] [address] [phone]       [facility] Follow up.   Why:  you can use the pharmacy here for medication ast. Contact information: [address] [facility] [address] [phone]          Allayne Stack, DO 02/13/2018, 2:17 PM PGY-1, [facility]

## 2018-02-11 NOTE — Care Management Note (Signed)
Case Management Note  Patient Details  Name: Gwendolyn Kerr MRN: [id number] Date of Birth: 04/10/1976  Subjective/Objective:   From home, pta indep, presents with hypokalemia, for dc today, NCM scheduled her hospital follow up apt and gave her information to get medication ast at the ch[facility] clinic at discharge.                   Action/Plan: DC home when ready.  Expected Discharge Date:  02/11/18               Expected Discharge Plan:  Home/Self Care  In-House Referral:     Discharge planning Services  CM Consult, Follow-up appt scheduled, Medication Assistance, Indigent Health Clinic  Post Acute Care Choice:    Choice offered to:     DME Arranged:    DME Agency:     HH Arranged:    HH Agency:     Status of Service:  Completed, signed off  If discussed at [employer] Meetings, dates discussed:    Additional Comments:  Leone Havenaylor, Josselyn Harkins [place], RN 02/11/2018, 3:42 PM

## 2018-03-09 ENCOUNTER — Other Ambulatory Visit (INDEPENDENT_AMBULATORY_CARE_PROVIDER_SITE_OTHER): Payer: Self-pay | Admitting: Physician Assistant

## 2018-03-09 ENCOUNTER — Telehealth (INDEPENDENT_AMBULATORY_CARE_PROVIDER_SITE_OTHER): Payer: Self-pay | Admitting: General Practice

## 2018-03-09 DIAGNOSIS — Z760 Encounter for issue of repeat prescription: Secondary | ICD-10-CM

## 2018-03-09 MED ORDER — SERTRALINE HCL 25 MG PO TABS: 25.0000 mg | ORAL_TABLET | Freq: Every day | ORAL | 0 refills | Status: DC

## 2018-03-09 MED ORDER — AMLODIPINE BESYLATE 5 MG PO TABS: 5.0000 mg | ORAL_TABLET | Freq: Every day | ORAL | 0 refills | Status: DC

## 2018-03-09 NOTE — Telephone Encounter (Signed)
Patient called requesting medication refill for  sertraline (ZOLOFT) 25 MG tablet   amLODipine (NORVASC) 5 MG tablet   Patient states she is out of both medication.  [facility] uses [facility] [address] [place], [place] - [address]  Please Advice [phone]  Thank you Gwendolyn Kerr

## 2018-03-09 NOTE — Telephone Encounter (Signed)
I have refilled for 30 days. Make sure to keep first appt on 03/16/18.

## 2018-03-09 NOTE — Telephone Encounter (Signed)
Patient is aware of 30 refill and to keep appointment on 9/3.she states she is supposed to travel by then. Maryjean Mornempestt S Roberts, CMA

## 2018-03-16 ENCOUNTER — Encounter (INDEPENDENT_AMBULATORY_CARE_PROVIDER_SITE_OTHER): Payer: Self-pay | Admitting: Physician Assistant

## 2018-03-16 ENCOUNTER — Ambulatory Visit (INDEPENDENT_AMBULATORY_CARE_PROVIDER_SITE_OTHER): Payer: Self-pay | Admitting: Physician Assistant

## 2018-03-16 VITALS — BP 117/76 | HR 79 | Temp 99.3°F | Resp 18 | Ht 69.0 in | Wt 182.0 lb

## 2018-03-16 DIAGNOSIS — E876 Hypokalemia: Secondary | ICD-10-CM

## 2018-03-16 DIAGNOSIS — F418 Other specified anxiety disorders: Secondary | ICD-10-CM

## 2018-03-16 DIAGNOSIS — Z760 Encounter for issue of repeat prescription: Secondary | ICD-10-CM

## 2018-03-16 DIAGNOSIS — D751 Secondary polycythemia: Secondary | ICD-10-CM

## 2018-03-16 DIAGNOSIS — Z09 Encounter for follow-up examination after completed treatment for conditions other than malignant neoplasm: Secondary | ICD-10-CM

## 2018-03-16 MED ORDER — AMLODIPINE BESYLATE 5 MG PO TABS: 5.0000 mg | ORAL_TABLET | Freq: Every day | ORAL | 1 refills | Status: AC

## 2018-03-16 MED ORDER — SERTRALINE HCL 50 MG PO TABS: 50.0000 mg | ORAL_TABLET | Freq: Every day | ORAL | 1 refills | Status: AC

## 2018-03-16 MED ORDER — SERTRALINE HCL 50 MG PO TABS
50.0000 mg | ORAL_TABLET | Freq: Every day | ORAL | 3 refills | Status: DC
Start: 2018-03-16 — End: 2018-03-16

## 2018-03-16 NOTE — Patient Instructions (Signed)
Community Resources  Advocacy/Legal Legal Aid [place]:  1-866-219-5262  /  336-272-0148  Family Justice Center:  336-641-7233  [facility] 24-hr Crisis line:  336-273-7273  Women's Resource Center, [place]:  336-275-6090  Court Watch (custody):  336-275-2346  [place] [facility]:   336-279-9299    Baby & Breastfeeding Car Seat Inspection @ Various [place] Fire Depts.- call 336-373-2177  [facility]  336-832-6860  [facility] 336-878-6712  WIC: 336-641-3663 ([place]);  336-641-7571 ([place])  La Leche League:  1-877-452-5321   Childcare [place] [facility]: 336-369-5097 ([place]) / 336-887-8224 ([place])  - Child Care Resources/ Referrals/ Scholarships  - Head Start/ Early Head Start (call or apply online)  [place] DHHS: [place] Pre-K :  1-800-859-0829 / 336-274-5437   Employment / Job Search [facility] [place]: 336-275-6090 / [address]  [place] [employer] (JobLink): 336-373-5922 ([place]) / 336-882-4141 ([place])  [facility] Resource/ Career Center: 336-275-9801 / 336-282-7307  [place] [place] [facility] Job & Career Center: 336-373-3764  DHHS Work First: 336-641-3447 ([place]) / 336-641-3447 ([place])  [facility] [place]:  336-676-5871   Financial Assistance [place] [facility]:  336-553-2657  [employer]: 336-235-0368  [employer] (furniture):  336-370-4002  [place] [facility]: 336-373-4264  Low Income Energy Assistance  336-641-3000   Food Assistance DHHS- SNAP/ Food Stamps: 336-641-4588  WIC: [place]- 336-641-3663 ;  [place] 336-641-7571  Little Green Book- Free Meals  Little Blue Book- Free Food Pantries  During the summer, text "FOOD" to 877877   General Health / Clinics (Adults) Orange Card (for Adults) through [facility]: (336) 895-4900  [facility]:   336-832-8035  [facility]:   336-832-4444  Health Department:  336-641-3245  [facility]:  336-415-3877 / 336-641-2100  [facility]anned [facility] [place]:   336-373-0678  [facility]:   336-334-4822 x 50251   Housing [place] Housing Coalition:   336-691-9521   Housing Authority:  336-275-8501  Affordable Housing Managemnt:  336-273-0568   Immigrant/ Refugee Center for [place]ians (UNCG):  336-256-1065  Faith Action [facility]:  336-379-0037  [employer]rivals Institute:  336-937-4701  [employer] World Services:  336-617-0381  African Services Coalition:  336-574-2677   LGBTQ YouthSAFE  www.youthsafe[place].org  PFLAG  336-541-6754 / info@[place].org  The Trevor Project:  1-866-488-7386   Mental Health/ Substance Use [facility]  336-387-6161  [employer] [facility]:  336-832-9700 or 1-800-711-2635  Carter's Circle of Care:  336-271-5888  [facility]:  336-294-1349  [facility] Care Services:  336-542-2884  [facility] (walk-ins)  336-676-6840 / [address]  [place]:  800-449-1287  Alcoholics Anonymous:  336-854-4278  Narcotics Anonymous:  800-365-1036  Quit Smoking Hotline:  800-QUIT-NOW (800-784-8669)   Parenting Children's Home Society:  800-632-1400  : Education Center & Support Groups:  336-832-6682  YWCA: 336-273-3461  UNCG: Bringing Out the Best:  336-334-3120               Thriving at Three (Hispanic families): 336-256-1066  Healthy Start ([facility]):  336-387-6161 x2288  Parents as Teachers:  336-691-0024  [place] [facility]- Learning Together (Immigrants): 336-369-5001   Poison Control 800-222-1222  Sports & Recreation [employer] Open Doors Application: ymcanwnc.org/join/open-doors-financial-assistance/  City of [place] Recreation Centers: http://www.-[place].gov/index.aspx?page=3615   Special Needs Family Support Network:  336-832-6507  Autism Society of [place]:   336-333-0197 x1402 or x1412 /  800-785-1035  TEACCH :  336-334-5773     ARC of [place]:  336-373-1076  Children's Developmental Service Agency (CDSA):  336-334-5601  CC4C (Care Coordination for Children):  336-641-7641   Transportation Medicaid Transportation: 336-641-4848 to apply  [place] Transit Authority: 336-335-6499 (reduced-fare bus ID to Medicaid/ Medicare/ Orange Card)  SCAT Paratransit services: Eligible riders only, call 336-333-6589 for application   Tutoring/Mentoring Black Child Development Institute: 336-230-2138  Big Brothers/ Big Sisters: 336-378-9100 ([place])  336-882-4167 ([place])  ACES through child's school: 336-370-2321  [employer] Achievers: contact your local Y  SHIELD Mentor Program: 336-337-2771   

## 2018-03-16 NOTE — Progress Notes (Signed)
Subjective:  Patient ID: Gwendolyn Kerr, female    DOB: [birth year]  Age: 42 y.o. MRN: [id number]  CC: hospital f/u   HPI Gwendolyn Kerr is a 42 y.o. female with a medical history of HTN and anxiety presents as a new patient on hospital discharge f/u. She initially presented to urgent care for elevated blood pressure, where it was incidentally discovered her K was 2.7 and subsequently sent to the ED. She was taking HCTZ-amiloride 5-50mg  at home for BP control. ED found K at 2.4 with QTc prolongation of 503 and U waves at ED. Attributed to HCTZ-amiloride. Pt subsequently stabilized and discharged on Amlodipine 5 mg. BP 117/76 mmHg today.     Main concern is of anxiety due to child custody issues in [place]. The father of her children does not want the children to come to the [place]. Pt misses her children and cries when she sees other parents with their children. She is remaining hopeful that God will find an answer to her child custody issues. She travels to [place] every six months but is unable to bring her children which starts the cycle of depression and anxiety again. Was prescribed Zoloft 25 mg and stopped taking as directed because she was told by friends she will take the medication for the rest of her life. However, she began taking again daily due to the severity of her depression with anxiety. Associated symptoms include heaviness in the legs, mental fogging, and anhedonia. PHQ9 10 and GAD7 6 in clinic today.    Outpatient Medications Prior to Visit  Medication Sig Dispense Refill  . amLODipine (NORVASC) 5 MG tablet Take 1 tablet (5 mg total) by mouth daily. 30 tablet 0  . Multiple Vitamin (MULTIVITAMIN) tablet Take 1 tablet by mouth daily.    . sertraline (ZOLOFT) 25 MG tablet Take 1 tablet (25 mg total) by mouth daily. 30 tablet 0  . naproxen (NAPROSYN) 375 MG tablet Take 1 tablet (375 mg total) by mouth 2 (two) times daily. (Patient not taking: Reported on 03/16/2018) 20 tablet 0    No facility-administered medications prior to visit.      ROS Review of Systems  Constitutional: Negative for chills, fever and malaise/fatigue.  Eyes: Negative for blurred vision.  Respiratory: Negative for shortness of breath.   Cardiovascular: Negative for chest pain and palpitations.  Gastrointestinal: Negative for abdominal pain and nausea.  Genitourinary: Negative for dysuria and hematuria.  Musculoskeletal: Negative for joint pain and myalgias.  Skin: Negative for rash.  Neurological: Negative for tingling and headaches.  Psychiatric/Behavioral: Positive for depression. The patient is nervous/anxious.     Objective:  BP 117/76 (BP Location: Left Arm, Patient Position: Sitting, Cuff Size: Normal)   Pulse 79   Temp 99.3 F (37.4 C) (Oral)   Resp 18   Ht 5\' 9"  (1.753 m)   Wt 182 lb (82.6 kg)   SpO2 98%   BMI 26.88 kg/m   BP/Weight 03/16/2018 02/11/2018 02/10/2018  Systolic BP 117 129 -  Diastolic BP 76 95 -  Wt. (Lbs) 182 185.63 -  BMI 26.88 - 29.07      Physical Exam  Constitutional: She is oriented to person, place, and time.  Well developed, well nourished, NAD, polite  HENT:  Head: Normocephalic and atraumatic.  Eyes: No scleral icterus.  Neck: Normal range of motion. Neck supple. No thyromegaly present.  Cardiovascular: Normal rate, regular rhythm and normal heart sounds.  Pulmonary/Chest: Effort normal and breath sounds normal.  Abdominal: Soft.  Bowel sounds are normal. There is no tenderness.  Musculoskeletal: She exhibits no edema.  Neurological: She is alert and oriented to person, place, and time.  Skin: Skin is warm and dry. No rash noted. No erythema. No pallor.  Psychiatric: Her behavior is normal. Thought content normal.  Tearful, flat affect, thoughts linear, insight poor, judgement poor  Vitals reviewed.    Assessment & Plan:    1. Hospital discharge follow-up - Notes reviewed. Redraw of labs required and managment for anxiety  requested.  2. Hypokalemia - Basic Metabolic Panel  3. Polycythemia - CBC with Differential  4. Depression with anxiety - Increase sertraline (ZOLOFT) 50 MG tablet; Take 1 tablet (50 mg total) by mouth daily.  Dispense: 90 tablet; Refill: 1 - I have messaged [name], LCSW to reach out to patient and discuss depression, anxiety, and possible legal assistance regarding child custody.   5. Medication refill - Refill amLODipine (NORVASC) 5 MG tablet; Take 1 tablet (5 mg total) by mouth daily.  Dispense: 90 tablet; Refill: 1   Meds ordered this encounter  Medications  . DISCONTD: sertraline (ZOLOFT) 50 MG tablet    Sig: Take 1 tablet (50 mg total) by mouth daily.    Dispense:  30 tablet    Refill:  3    Order Specific Question:   Supervising Provider    Answer:   Hoy Register [[id number]]  . sertraline (ZOLOFT) 50 MG tablet    Sig: Take 1 tablet (50 mg total) by mouth daily.    Dispense:  90 tablet    Refill:  1    Please disregard 30 day fill. Please dispense 90 day rx. Thank you.    Order Specific Question:   Supervising Provider    Answer:   Hoy Register [[id number]]  . amLODipine (NORVASC) 5 MG tablet    Sig: Take 1 tablet (5 mg total) by mouth daily.    Dispense:  90 tablet    Refill:  1    Order Specific Question:   Supervising Provider    Answer:   Hoy Register [[id number]]    Follow-up: Return in about 4 weeks (around 04/13/2018) for depression with anxiety.   Loletta Specter PA

## 2018-03-17 ENCOUNTER — Telehealth (INDEPENDENT_AMBULATORY_CARE_PROVIDER_SITE_OTHER): Payer: Self-pay

## 2018-03-17 LAB — CBC WITH DIFFERENTIAL/PLATELET
BASOS: 0 %
Basophils Absolute: 0 10*3/uL (ref 0.0–0.2)
EOS (ABSOLUTE): 0.1 10*3/uL (ref 0.0–0.4)
EOS: 1 %
Hematocrit: 41.7 % (ref 34.0–46.6)
Hemoglobin: 13.3 g/dL (ref 11.1–15.9)
IMMATURE GRANULOCYTES: 0 %
Immature Grans (Abs): 0 10*3/uL (ref 0.0–0.1)
Lymphocytes Absolute: 3.1 10*3/uL (ref 0.7–3.1)
Lymphs: 41 %
MCH: 27.2 pg (ref 26.6–33.0)
MCHC: 31.9 g/dL (ref 31.5–35.7)
MCV: 85 fL (ref 79–97)
MONOCYTES: 3 %
MONOS ABS: 0.3 10*3/uL (ref 0.1–0.9)
NEUTROS PCT: 55 %
Neutrophils Absolute: 4 10*3/uL (ref 1.4–7.0)
Platelets: 211 10*3/uL (ref 150–450)
RBC: 4.89 x10E6/uL (ref 3.77–5.28)
RDW: 14.5 % (ref 12.3–15.4)
WBC: 7.4 10*3/uL (ref 3.4–10.8)

## 2018-03-17 LAB — BASIC METABOLIC PANEL
BUN / CREAT RATIO: 12 (ref 9–23)
BUN: 11 mg/dL (ref 6–24)
CALCIUM: 9.2 mg/dL (ref 8.7–10.2)
CO2: 24 mmol/L (ref 20–29)
Chloride: 103 mmol/L (ref 96–106)
Creatinine, Ser: 0.91 mg/dL (ref 0.57–1.00)
GFR calc Af Amer: 90 mL/min/{1.73_m2} (ref >59)
GFR, EST NON AFRICAN AMERICAN: 78 mL/min/{1.73_m2} (ref >59)
GLUCOSE: 104 mg/dL — AB (ref 65–99)
Potassium: 3.9 mmol/L (ref 3.5–5.2)
SODIUM: 144 mmol/L (ref 134–144)

## 2018-03-17 NOTE — Telephone Encounter (Signed)
Patient is aware that labs are normal except for mild elevation of glucose. Will need a1c at next OV. Patient will be in [place] for the next 6 months. Will call office when she returns. Maryjean Morn, CMA

## 2018-03-17 NOTE — Telephone Encounter (Signed)
-----   Message from Loletta Specter, PA-C sent at 03/17/2018 12:35 PM EDT ----- Labs normal except for mild elevation of glucose. We will perform an A1c at her follow up visit.

## 2018-03-26 ENCOUNTER — Telehealth: Payer: Self-pay | Admitting: Licensed Clinical Social Worker

## 2018-03-26 NOTE — Telephone Encounter (Signed)
LCSWA attempted to contact pt to follow up on behavioral health screens from prior appointment. LCSWA left message for a return call.  

## 2018-03-29 ENCOUNTER — Telehealth: Payer: Self-pay | Admitting: Licensed Clinical Social Worker

## 2018-03-29 NOTE — Telephone Encounter (Signed)
Call placed to pt. LCSWA introduced self and explained role at [facility]. LCSWA disclosed that she received a consult from PA-C Lily KocherGomez to address resources to assist with minor children obtaining permanent residency in the [place].   Pt disclosed that she recently relocated to [place] to be near her sister. She has no plans on returning to  in the near future.   Pt reports feelings of sadness due to being apart from her children. She plans on visiting them for approximately six months in [place]; however, needs to renew [employer] Card that expires in the next three months.   LCSWA validated pt's feelings and provided encouragement. Strategies to manage mental health were discussed, in addition, to the importance of compliance with medication management.   Pt's forwarding address is [address], [place], [place] [id number]. LCSWA agreed to mail out behavioral health resources to assist with crisis intervention, psychotherapy, and medication management. Information on [employer] will be provided to pt to assist with legal questions and referral to supportive resources in [place].    Pt was strongly encouraged to contact LCSWA with any additional questions or concerns.

## 2019-03-26 IMAGING — CR DG CHEST 2V
2 series · 2 of 2 positions shown · non-contrast
Comparison: None.

CLINICAL DATA: Shortness of breath

EXAM:
CHEST - 2 VIEW

[chest pa]
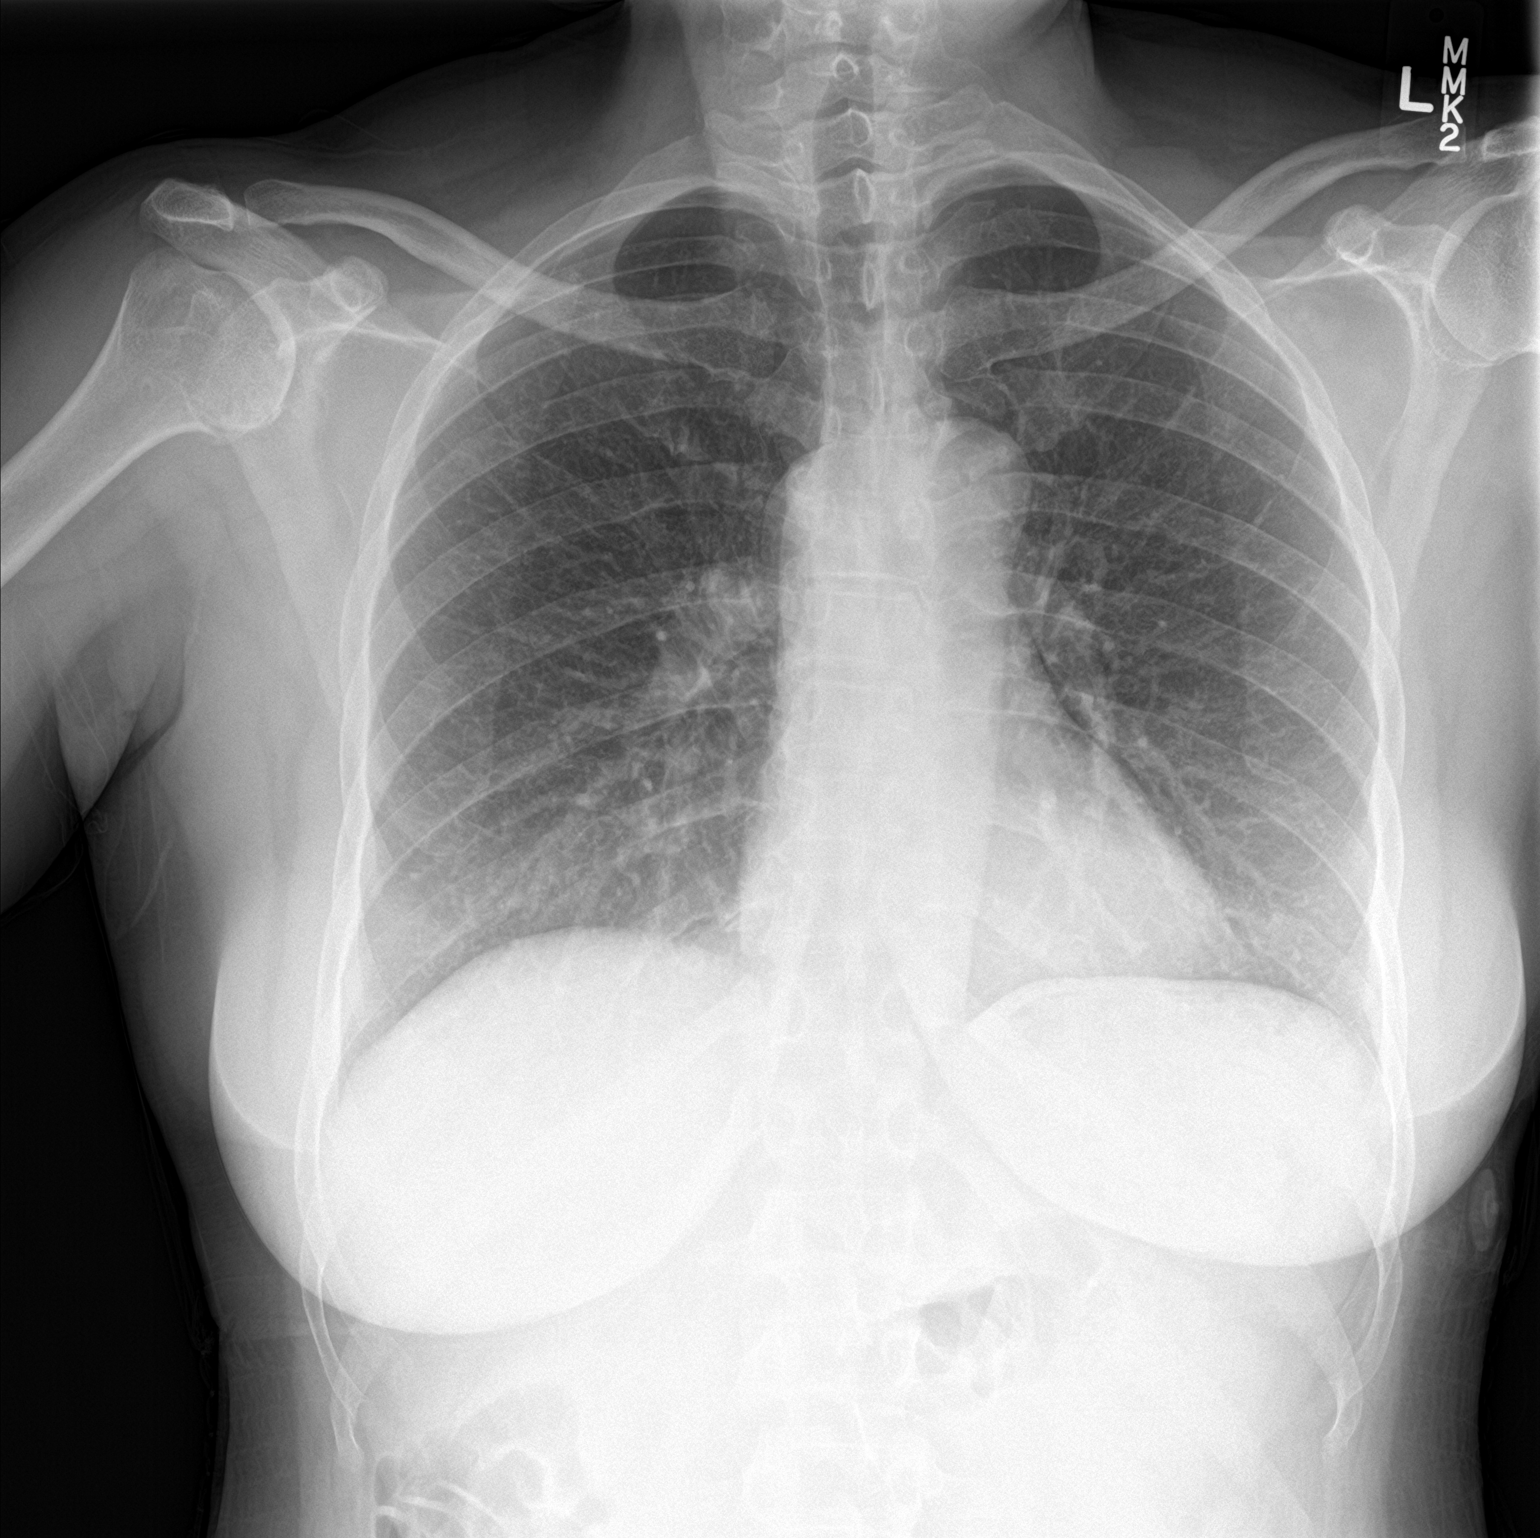

[chest lat]
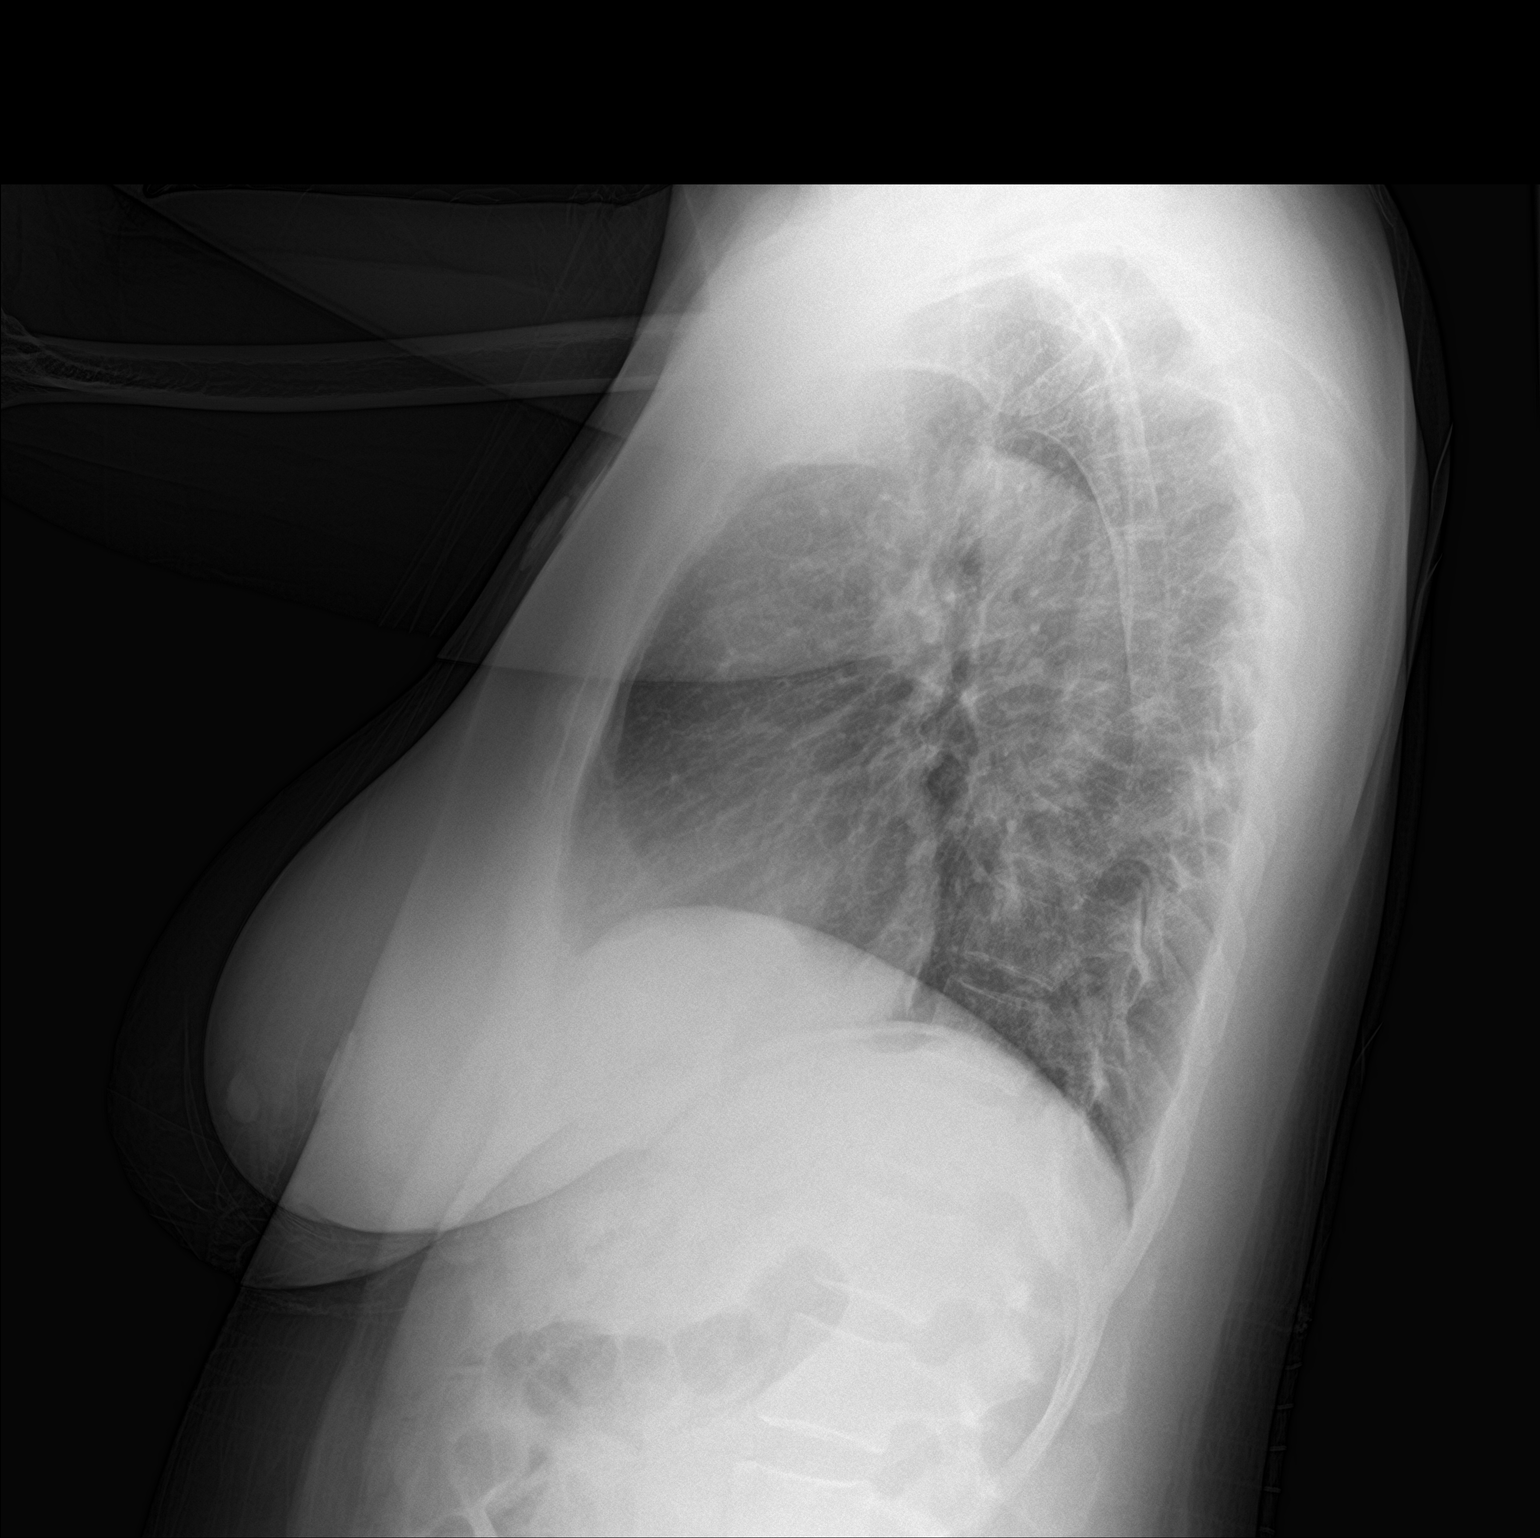

[2 of 2 positions shown; findings below may reference images not displayed]

FINDINGS: Lungs are clear.  No pleural effusion or pneumothorax.

The heart is normal in size.

Visualized osseous structures are within normal limits.
IMPRESSION: Normal chest radiographs.
